# Patient Record
Sex: Female | Born: 1935 | Race: Black or African American | Hispanic: No | Marital: Single | Smoking: Never smoker
Health system: Southern US, Community
[De-identification: ages and names within clinical notes are randomized; demographics above are authoritative.]

## PROBLEM LIST (undated history)

## (undated) DIAGNOSIS — D510 Vitamin B12 deficiency anemia due to intrinsic factor deficiency: Secondary | ICD-10-CM

## (undated) DIAGNOSIS — E049 Nontoxic goiter, unspecified: Secondary | ICD-10-CM

## (undated) DIAGNOSIS — I509 Heart failure, unspecified: Secondary | ICD-10-CM

## (undated) DIAGNOSIS — D649 Anemia, unspecified: Secondary | ICD-10-CM

## (undated) DIAGNOSIS — Z87442 Personal history of urinary calculi: Secondary | ICD-10-CM

## (undated) DIAGNOSIS — I495 Sick sinus syndrome: Secondary | ICD-10-CM

## (undated) DIAGNOSIS — F09 Unspecified mental disorder due to known physiological condition: Secondary | ICD-10-CM

## (undated) DIAGNOSIS — I499 Cardiac arrhythmia, unspecified: Secondary | ICD-10-CM

## (undated) DIAGNOSIS — J45909 Unspecified asthma, uncomplicated: Secondary | ICD-10-CM

## (undated) DIAGNOSIS — I10 Essential (primary) hypertension: Secondary | ICD-10-CM

## (undated) DIAGNOSIS — I4891 Unspecified atrial fibrillation: Secondary | ICD-10-CM

## (undated) HISTORY — DX: Personal history of urinary calculi: Z87.442

## (undated) HISTORY — DX: Unspecified asthma, uncomplicated: J45.909

## (undated) HISTORY — DX: Cardiac arrhythmia, unspecified: I49.9

## (undated) HISTORY — DX: Sick sinus syndrome: I49.5

## (undated) HISTORY — DX: Heart failure, unspecified: I50.9

## (undated) HISTORY — DX: Unspecified atrial fibrillation: I48.91

## (undated) HISTORY — DX: Nontoxic goiter, unspecified: E04.9

## (undated) HISTORY — DX: Essential (primary) hypertension: I10

## (undated) HISTORY — DX: Vitamin B12 deficiency anemia due to intrinsic factor deficiency: D51.0

## (undated) HISTORY — DX: Anemia, unspecified: D64.9

## (undated) HISTORY — PX: NO PAST SURGERIES: SHX2092

## (undated) HISTORY — DX: Unspecified mental disorder due to known physiological condition: F09

---

## 2005-09-18 ENCOUNTER — Ambulatory Visit: Payer: Self-pay | Admitting: Internal Medicine

## 2006-08-21 ENCOUNTER — Other Ambulatory Visit: Payer: Self-pay

## 2006-08-21 ENCOUNTER — Inpatient Hospital Stay: Payer: Self-pay | Admitting: Internal Medicine

## 2006-10-31 ENCOUNTER — Ambulatory Visit: Payer: Self-pay | Admitting: Ophthalmology

## 2006-11-07 ENCOUNTER — Ambulatory Visit: Payer: Self-pay | Admitting: Ophthalmology

## 2007-02-26 ENCOUNTER — Other Ambulatory Visit: Payer: Self-pay

## 2007-02-26 ENCOUNTER — Inpatient Hospital Stay: Payer: Self-pay | Admitting: Internal Medicine

## 2008-10-19 ENCOUNTER — Ambulatory Visit: Payer: Self-pay | Admitting: Internal Medicine

## 2008-10-22 ENCOUNTER — Ambulatory Visit: Payer: Self-pay | Admitting: Internal Medicine

## 2009-10-02 ENCOUNTER — Inpatient Hospital Stay: Payer: Self-pay | Admitting: Internal Medicine

## 2010-01-18 ENCOUNTER — Ambulatory Visit: Payer: Self-pay | Admitting: Internal Medicine

## 2010-02-13 ENCOUNTER — Inpatient Hospital Stay: Payer: Self-pay | Admitting: Internal Medicine

## 2010-02-17 ENCOUNTER — Ambulatory Visit: Payer: Self-pay | Admitting: Internal Medicine

## 2010-10-14 ENCOUNTER — Inpatient Hospital Stay: Payer: Self-pay | Admitting: Internal Medicine

## 2012-04-22 IMAGING — CT CT CHEST W/ CM
1 series · 15 of 33 positions shown, 19 images · IV contrast (APPLIED)
Comparison: none

REASON FOR EXAM: hi ddimer, sob, hypoxia, clr cxr
COMMENTS:

PROCEDURE:     CT  - CT CHEST (FOR PE) W  - February 13, 2010  [DATE]
RESULT:
TECHNIQUE: Helical 3 mm sections were obtained from the thoracic inlet
through the lung bases status post intravenous administration of 70 mL
Vsovue-PSO.

[Series 6: soft tissue · axial · 0.60mm/px · z∈[-16,+240]mm · 15 of 101 slices shown, 19 images]
[im 8/101  mediastinal]
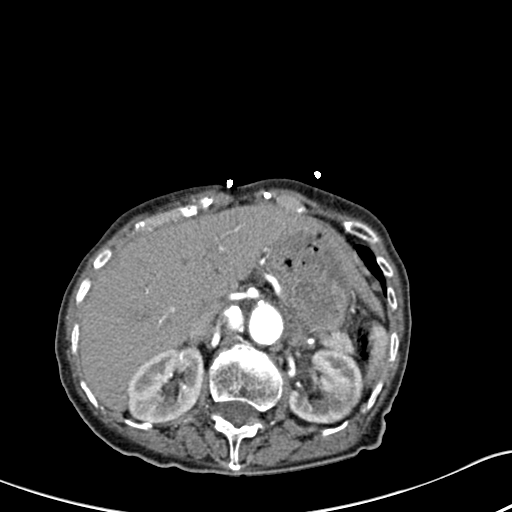
[im 8/101  lung]
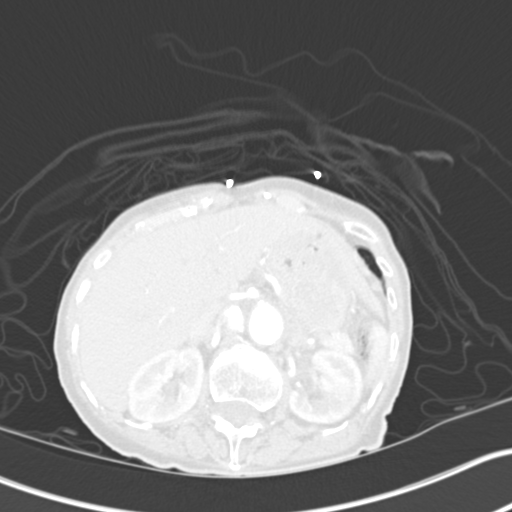
[im 15/101  lung]
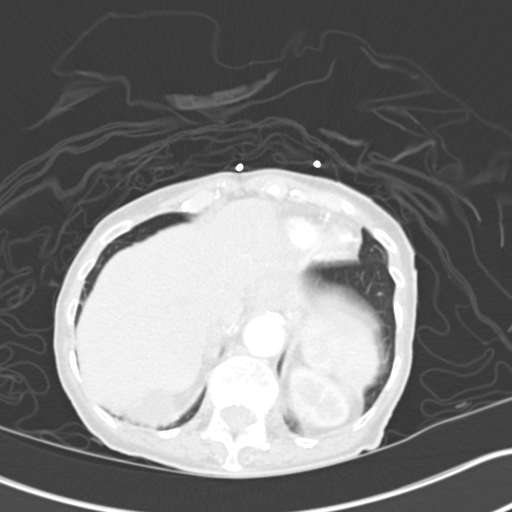
[im 21/101  lung]
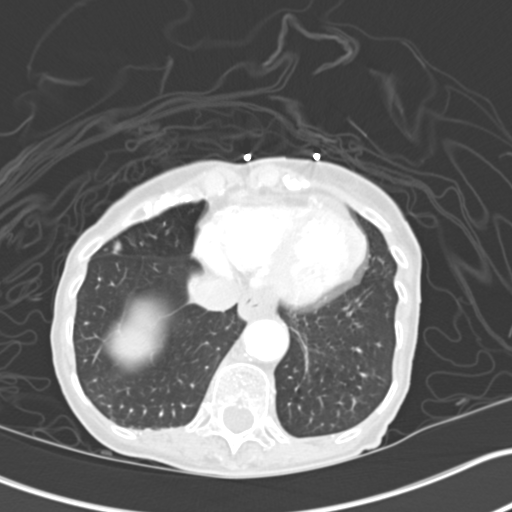
[im 26/101  lung]
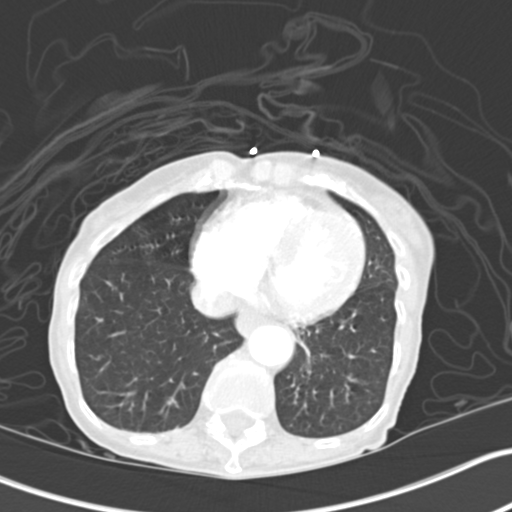
[im 34/101  mediastinal]
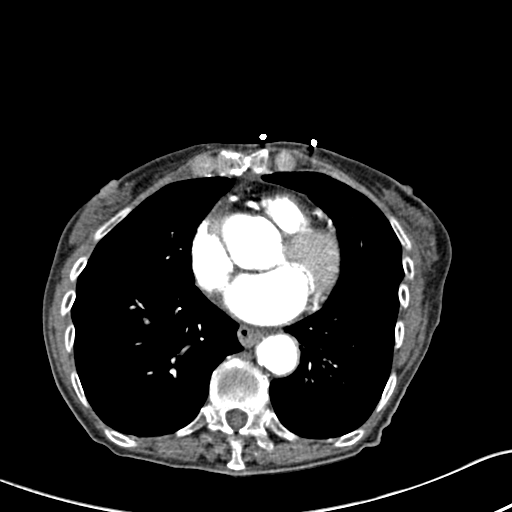
[im 34/101  lung]
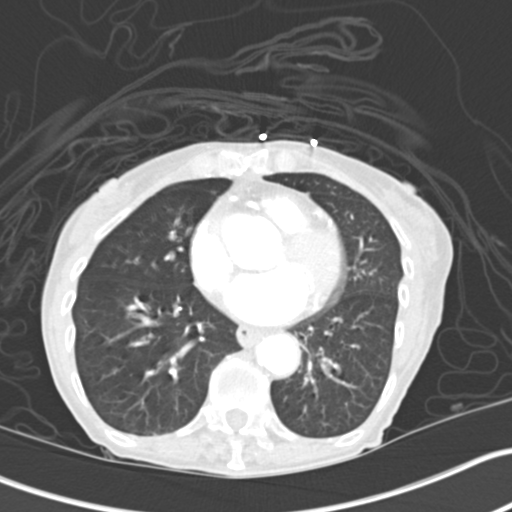
[im 41/101  lung]
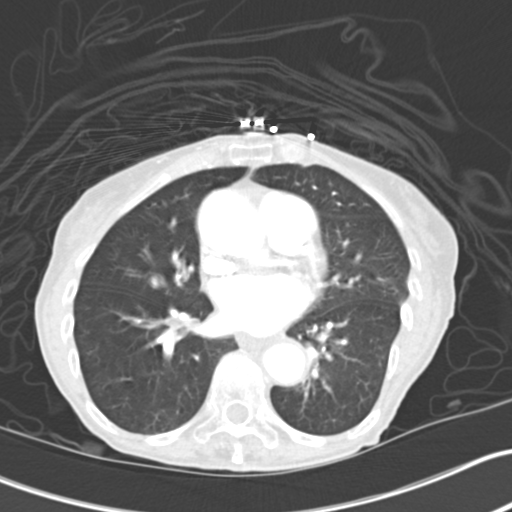
[im 48/101  lung]
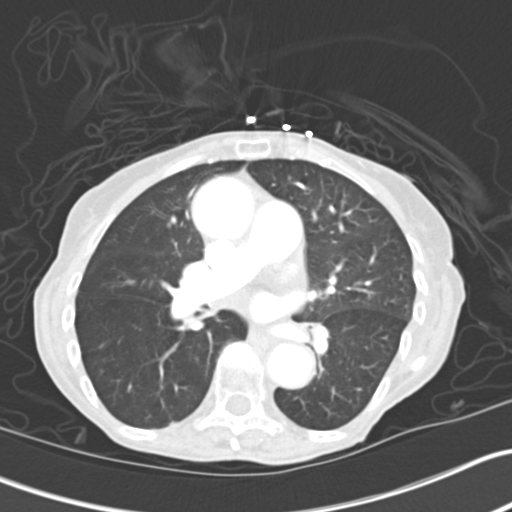
[im 52/101  lung]
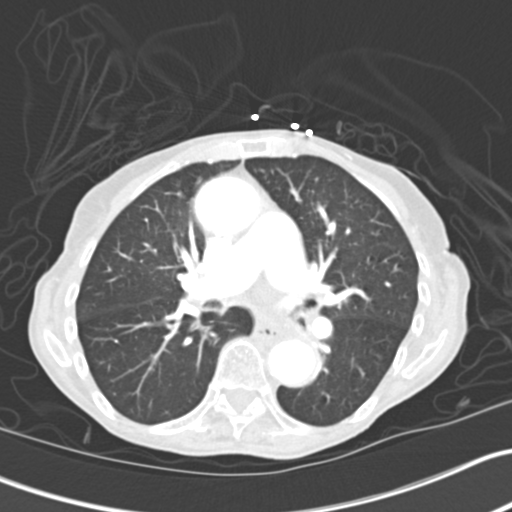
[im 56/101  mediastinal]
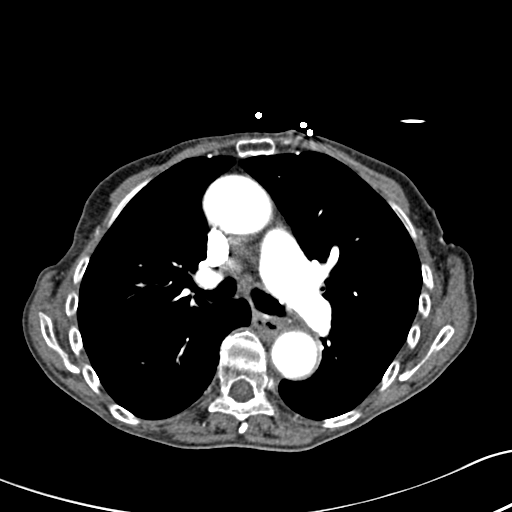
[im 56/101  lung]
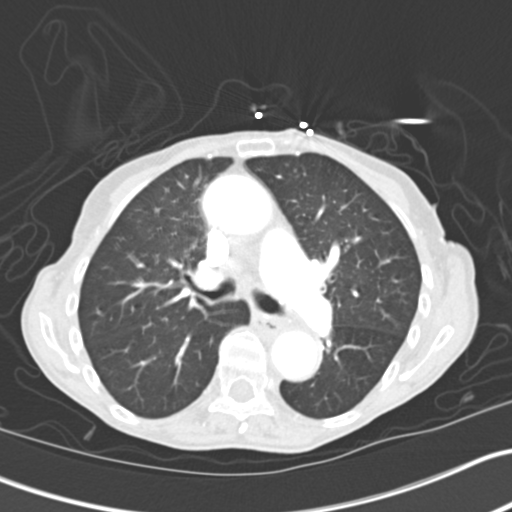
[im 61/101  lung]
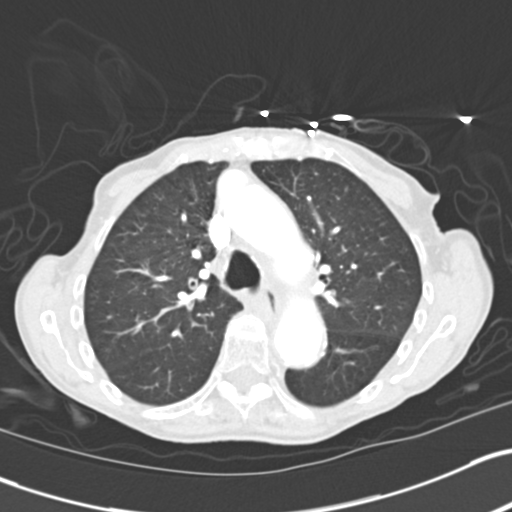
[im 67/101  lung]
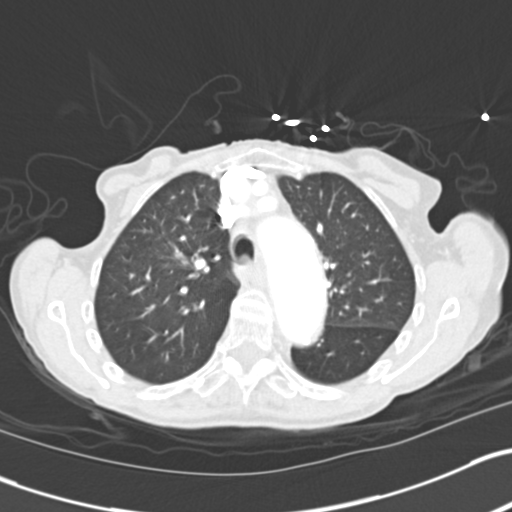
[im 75/101  lung]
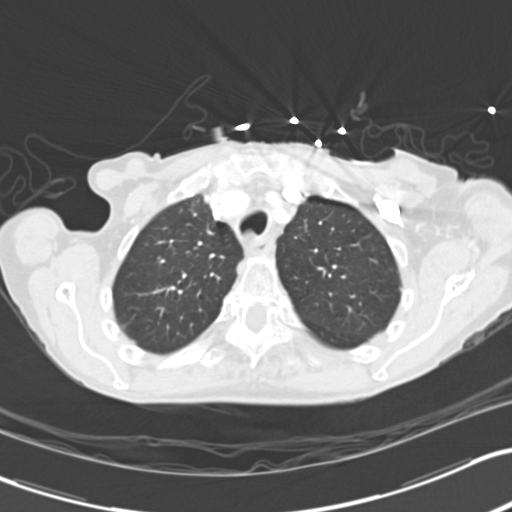
[im 81/101  mediastinal]
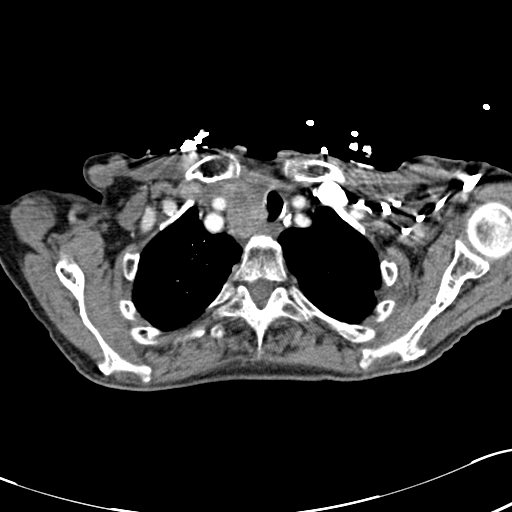
[im 81/101  lung]
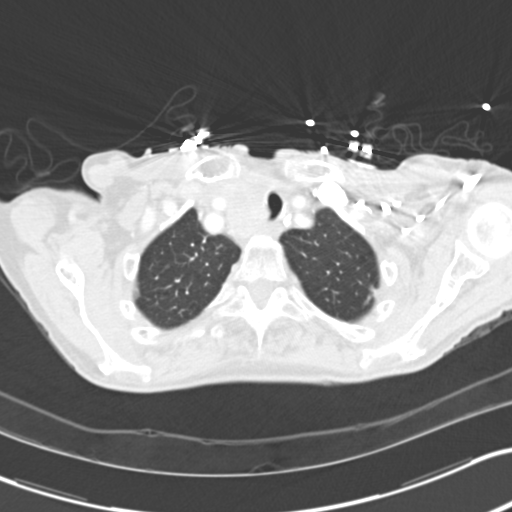
[im 86/101  lung]
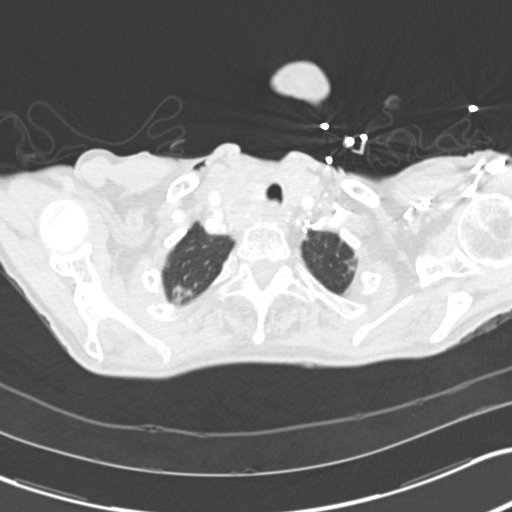
[im 93/101  lung]
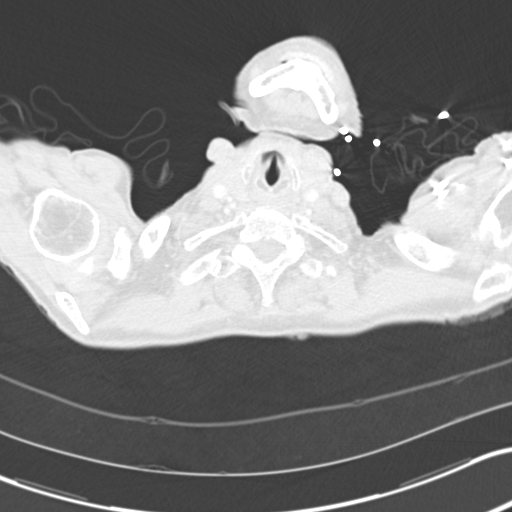

[15 of 33 positions shown; findings below may reference images not displayed]

FINDINGS: There is no evidence of a filling defect within the main, lobar or
segmental pulmonary arteries. Evaluation of the thoracic inlet demonstrates
diffuse enlargement of the thyroid gland right lobe greater than left.
Punctate calcification is identified within the right lobe. Multiple low
attenuating nodules project in the right lobe.

Evaluation of the mediastinum, hilar regions and structures demonstrates no
evidence of mediastinal or hilar adenopathy or masses. Evaluation of the
lung parenchyma demonstrates mild interstitial changes within the lungs
primarily within the lung bases. A vague nodular density projects within the
lateral periphery of the right middle lobe which when compared to the
previous study is unchanged. No further masses or nodules are identified.
Hypoventilation is identified within the lung bases. A cyst is identified
within the right kidney. The remaining visualized upper abdominal viscera
demonstrate a tortuous dilated aorta which demonstrates horizontal
orientation at the level of the renal arteries. The remaining visualized
upper abdominal viscera are grossly unremarkable.
IMPRESSION: 1. No CT evidence for pulmonary arterial embolic disease.
2. Ill-defined nodule within the right middle lobe. This nodule measures
approximately 8 mm in diameter.
3. Ectatic thoracic aorta without evidence of aneurysmal dilatation.
4. Markedly tortuous abdominal aorta with aneurysmal dilatation incompletely
visualized.
5. Interstitial changes.
6. Not mentioned above, there is no CT evidence of pulmonary arterial
embolic disease.

## 2012-04-23 IMAGING — US US EXTREM LOW VENOUS BILAT
1 series · 17 of 24 positions shown · non-contrast
Comparison: none

REASON FOR EXAM: elevated d-dimer, sob
COMMENTS:

PROCEDURE:     US  - US DOPPLER LOW EXTR BILATERAL  - February 14, 2010  [DATE]
RESULT:     Comparison: None

[Series 1: us extrem low venous bilat · 17 of 52 slices shown]
[im 1/52]
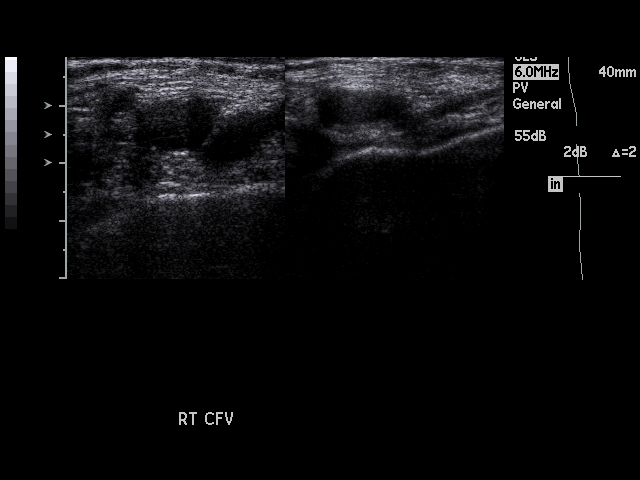
[im 5/52]
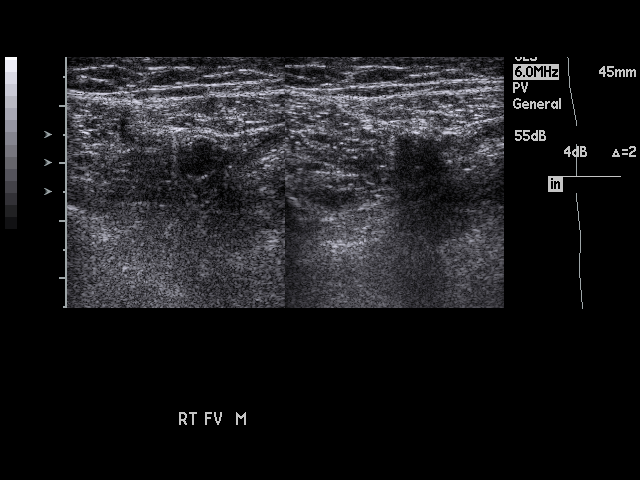
[im 7/52]
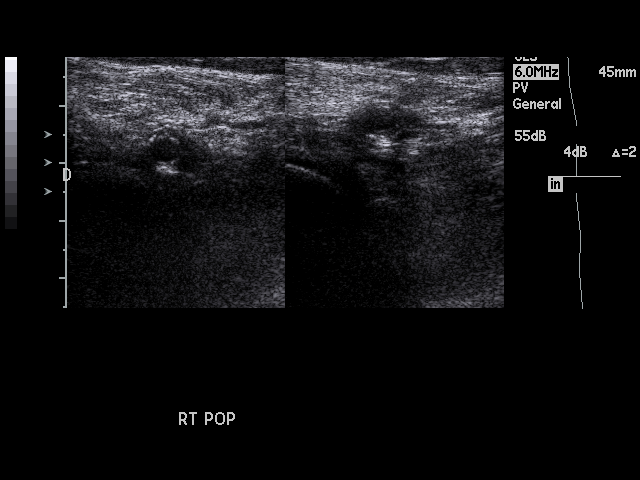
[im 9/52]
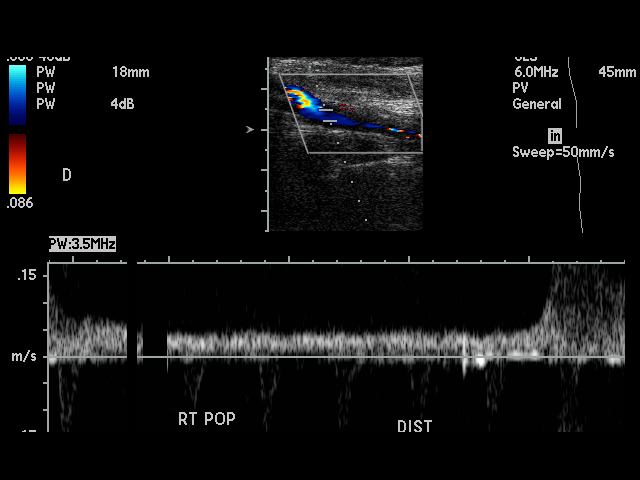
[im 14/52]
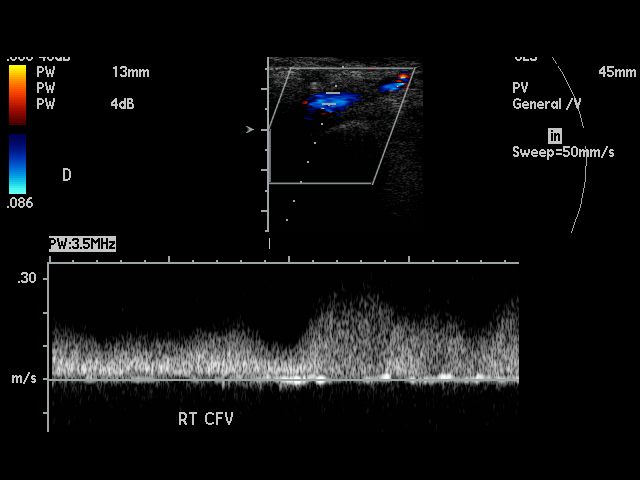
[im 16/52]
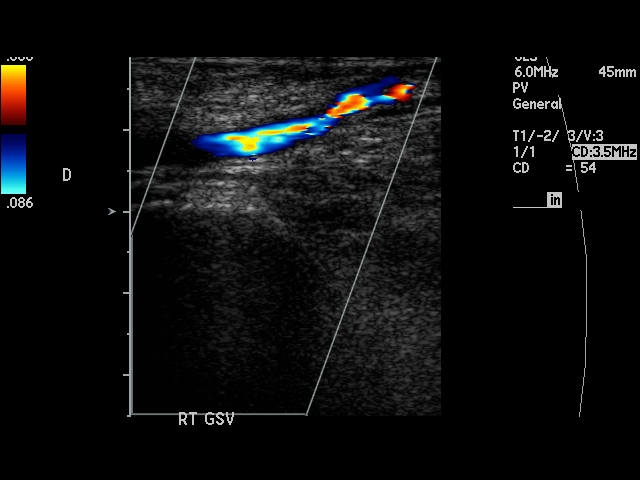
[im 20/52]
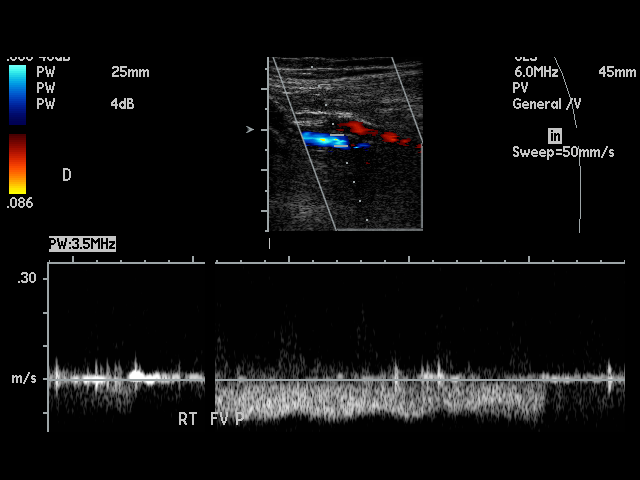
[im 23/52]
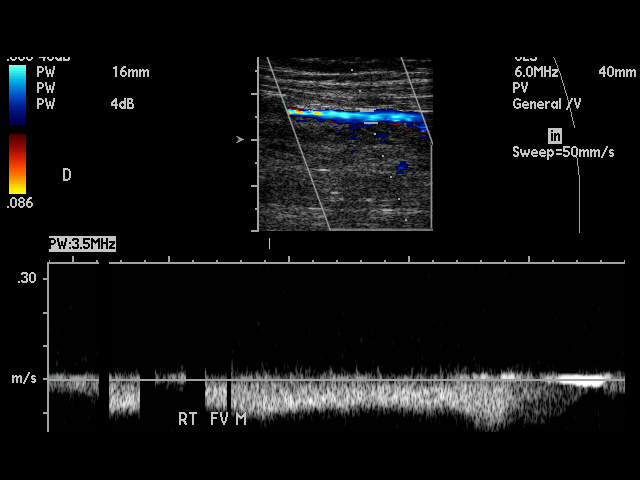
[im 27/52]
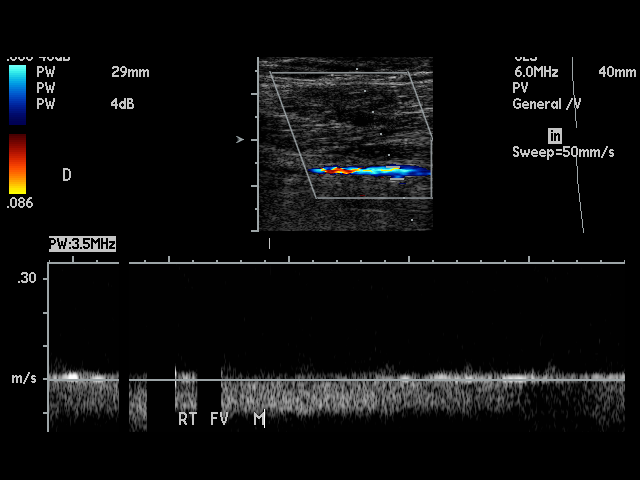
[im 29/52]
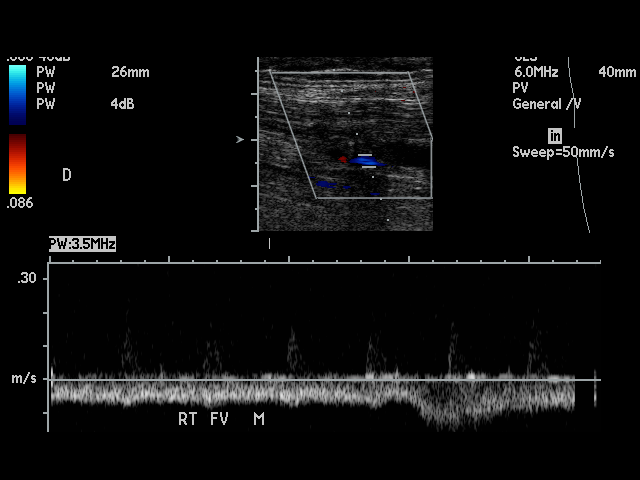
[im 32/52]
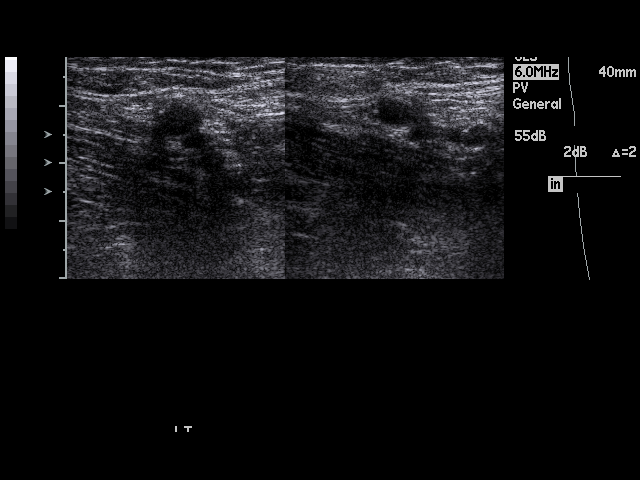
[im 36/52]
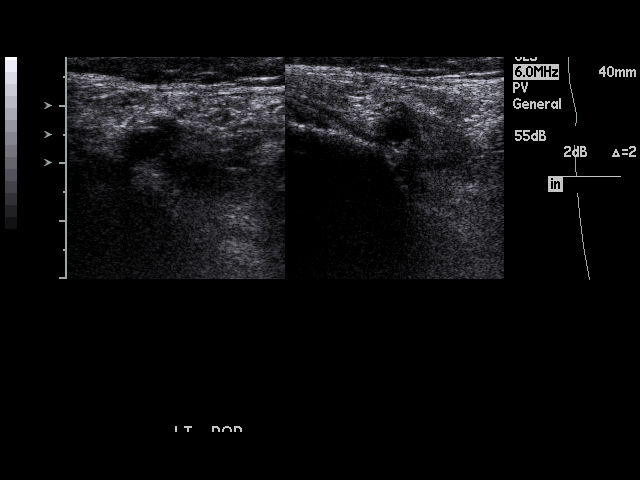
[im 38/52]
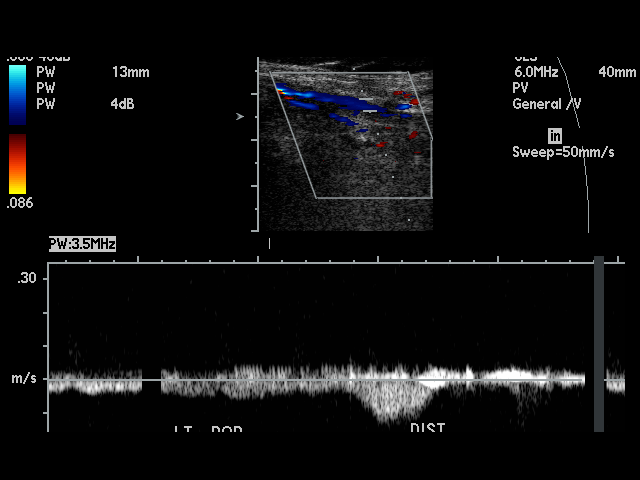
[im 43/52]
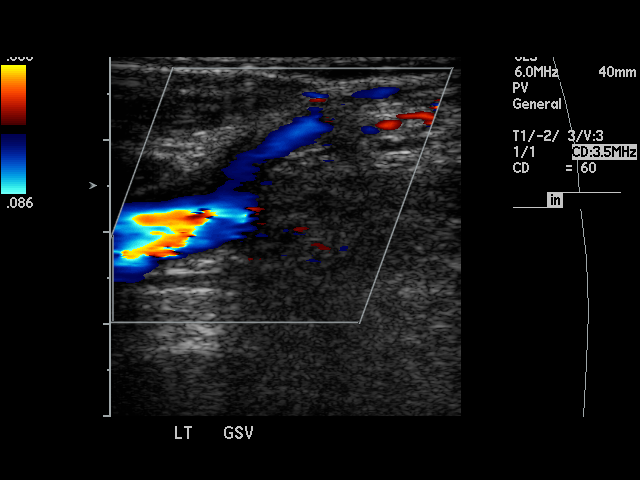
[im 45/52]
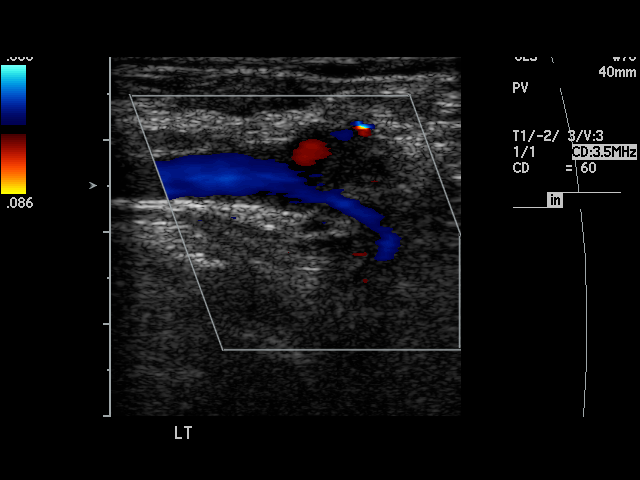
[im 47/52]
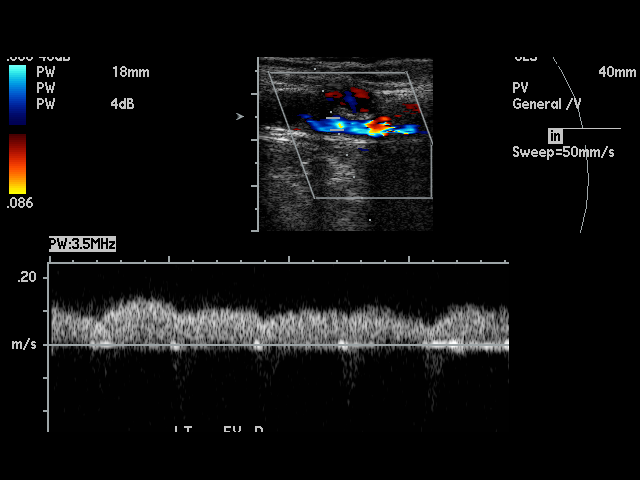
[im 52/52]
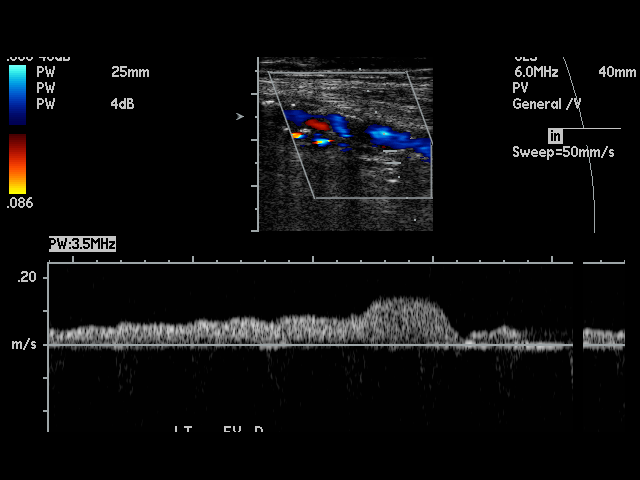

[17 of 24 positions shown; findings below may reference images not displayed]

FINDINGS: Multiple longitudinal and transverse gray-scale as well as color
and spectral Doppler images of  bilateral lower extremity veins were
obtained from the common femoral veins through the popliteal veins.

The right common femoral, greater saphenous, femoral, popliteal veins, and
venous trifurcation are patent, demonstrating normal color-flow and
compressibility. No intraluminal thrombus is identified.There is normal
respiratory variation and augmentation demonstrated at all vein levels.

The left common femoral, greater saphenous, femoral, popliteal veins, and
venous trifurcation are patent, demonstrating normal color-flow and
compressibility. No intraluminal thrombus is identified.There is normal
respiratory variation and augmentation demonstrated at all vein levels.
IMPRESSION: 1. No evidence of DVT in the right lower extremity.
2. No evidence of DVT in the left lower extremity.

## 2012-04-24 IMAGING — CR DG CHEST 2V
1 series · 2 of 2 positions shown · non-contrast
Comparison: none

REASON FOR EXAM: sob
COMMENTS:

PROCEDURE:     DXR - DXR CHEST PA (OR AP) AND LATERAL  - February 15, 2010 [DATE]
RESULT:     Comparison: None

[Series 1: view not recorded · 0.17mm/px · 2 of 2 slices shown]
[im 1/2]
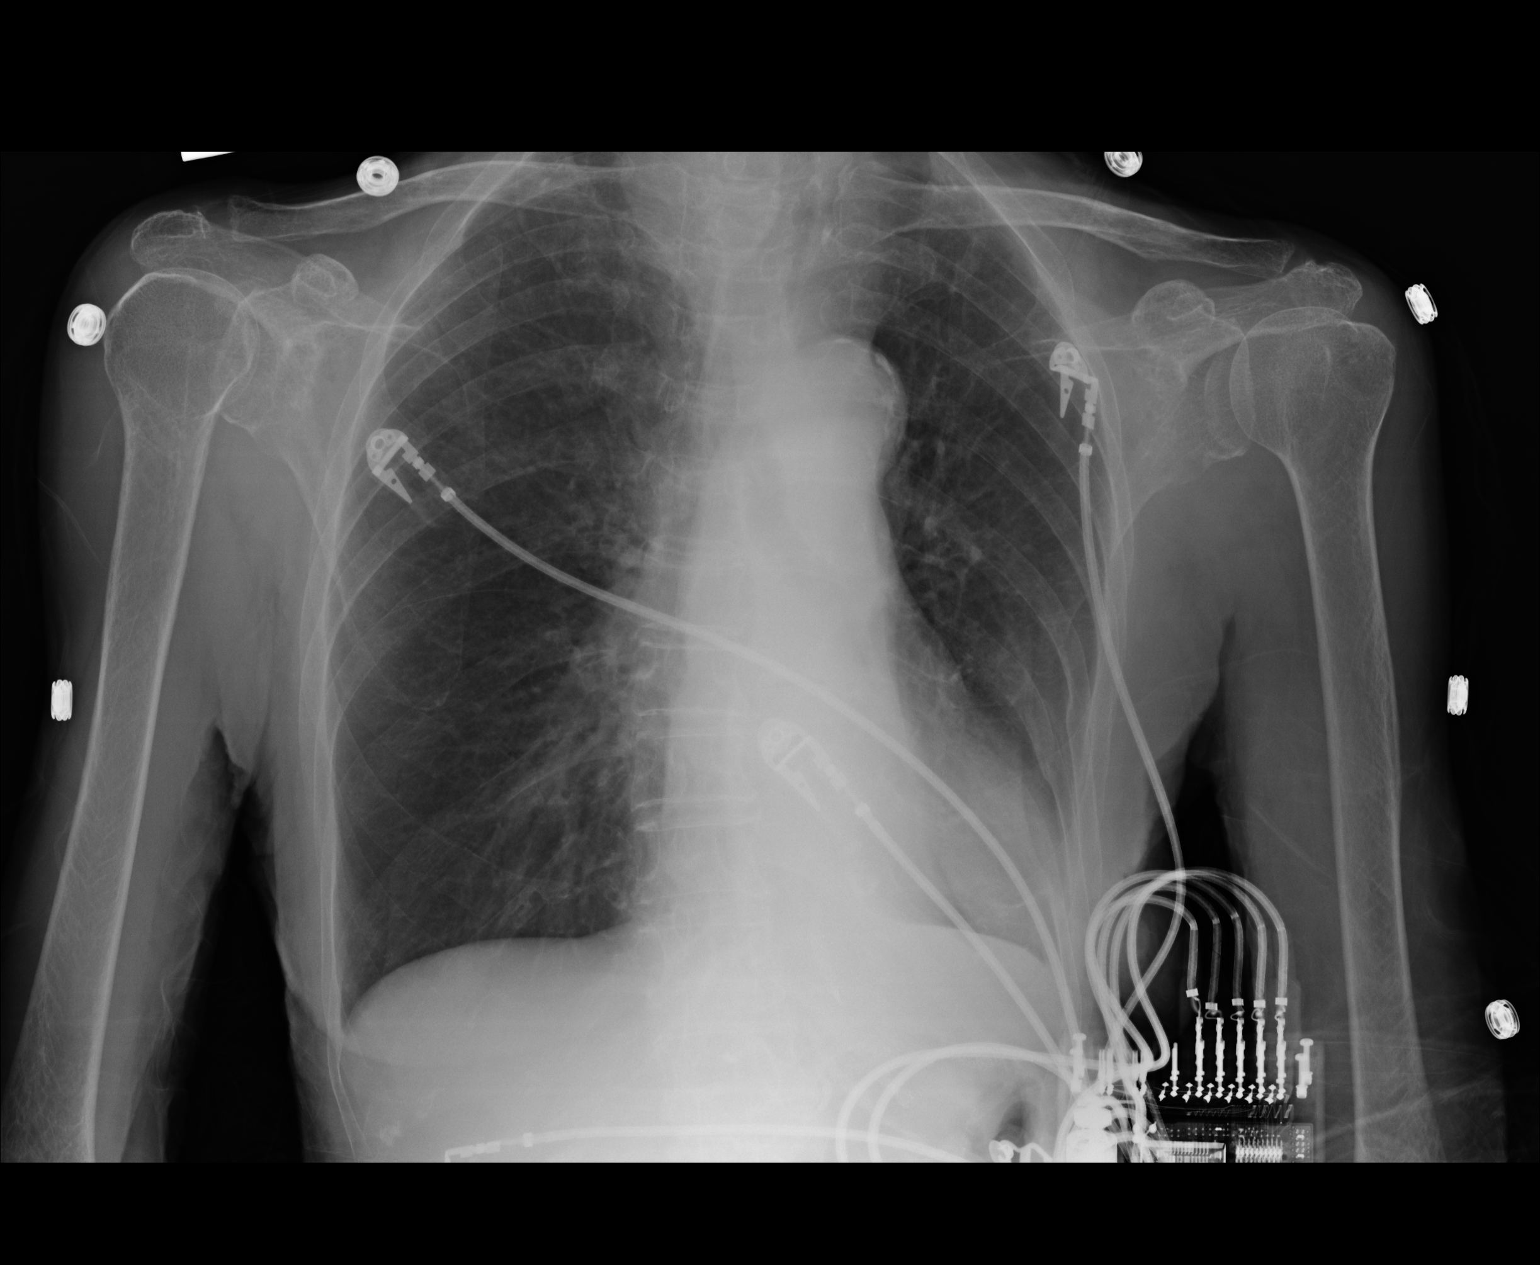
[im 2/2]
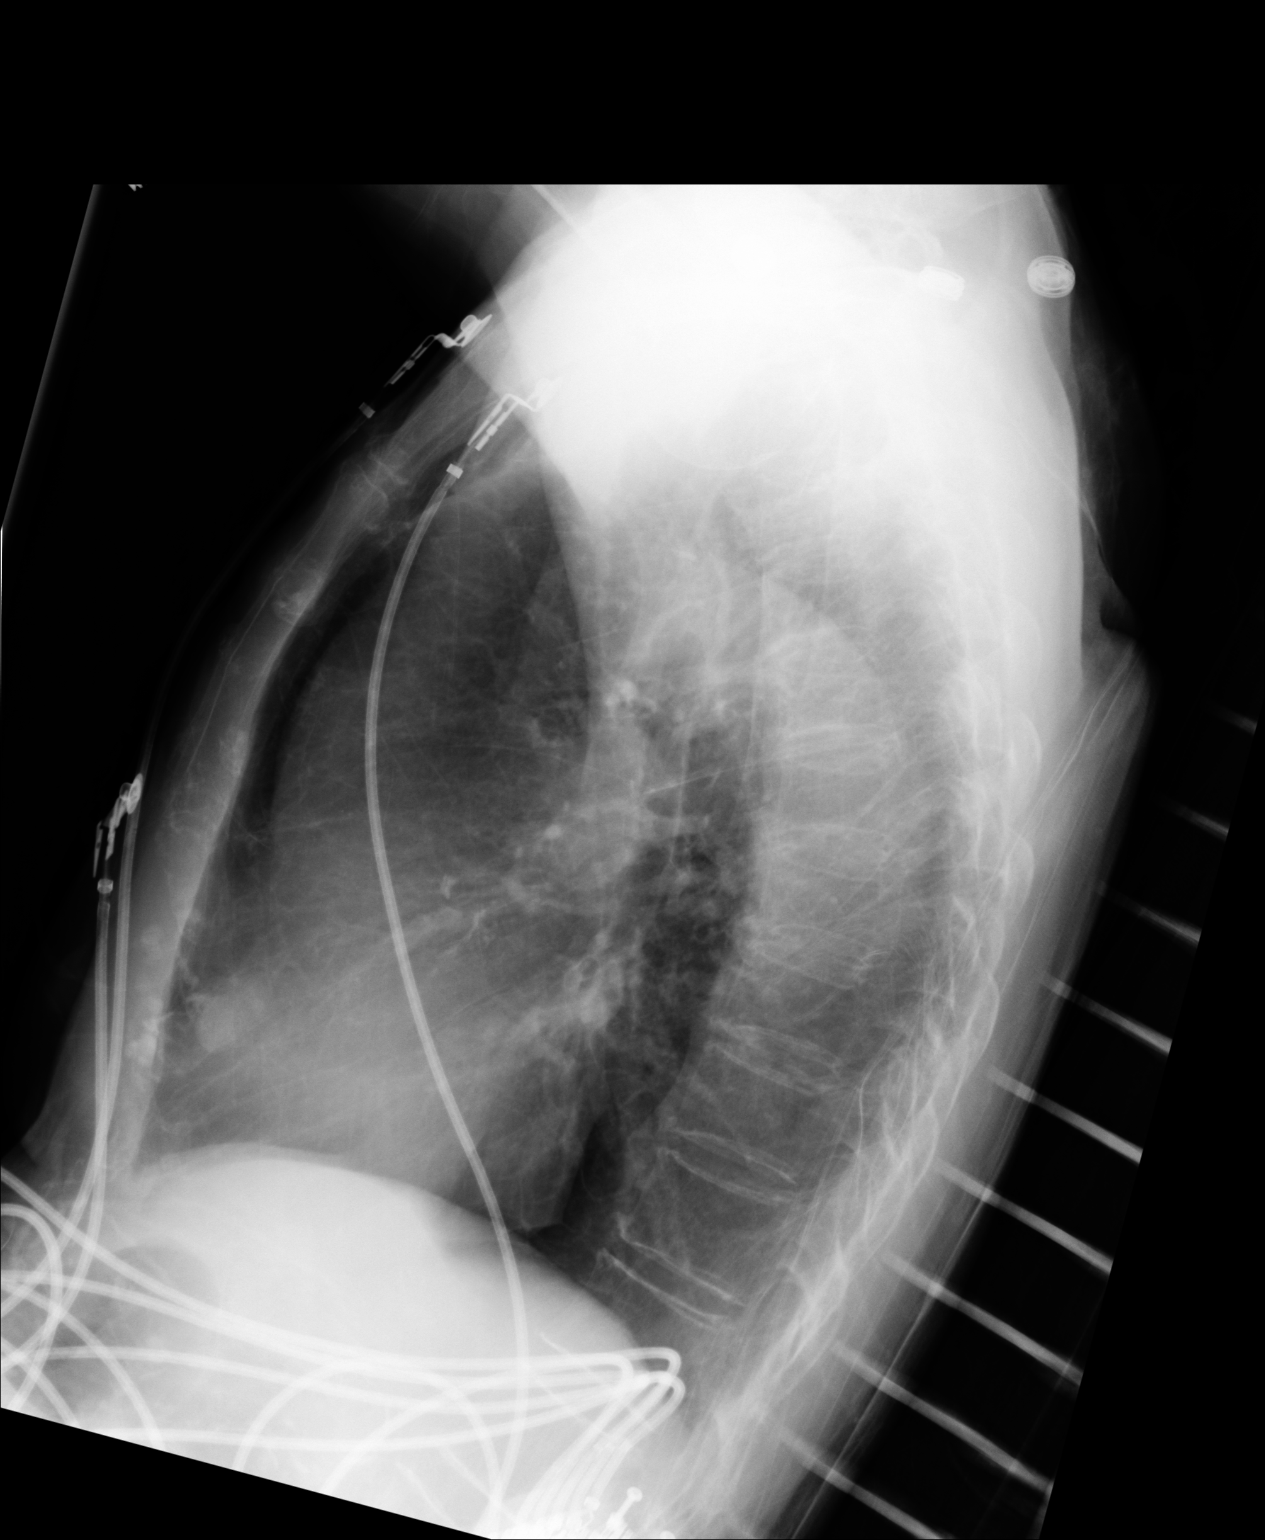

[2 of 2 positions shown; findings below may reference images not displayed]

FINDINGS: PA and lateral chest radiographs are provided. The lungs are hyperinflated
likely secondary to COPD. There is no focal parenchymal opacity, pleural
effusion, or pneumothorax. The heart and mediastinum are unremarkable. The
osseous structures are unremarkable.
IMPRESSION: No acute disease of the chest.

## 2013-08-08 DIAGNOSIS — I499 Cardiac arrhythmia, unspecified: Secondary | ICD-10-CM

## 2013-08-08 DIAGNOSIS — I10 Essential (primary) hypertension: Secondary | ICD-10-CM

## 2013-08-08 DIAGNOSIS — I4891 Unspecified atrial fibrillation: Secondary | ICD-10-CM

## 2013-08-08 DIAGNOSIS — D510 Vitamin B12 deficiency anemia due to intrinsic factor deficiency: Secondary | ICD-10-CM

## 2013-08-08 DIAGNOSIS — I509 Heart failure, unspecified: Secondary | ICD-10-CM

## 2013-08-08 HISTORY — DX: Essential (primary) hypertension: I10

## 2013-08-08 HISTORY — DX: Heart failure, unspecified: I50.9

## 2013-08-08 HISTORY — DX: Cardiac arrhythmia, unspecified: I49.9

## 2013-08-08 HISTORY — DX: Unspecified atrial fibrillation: I48.91

## 2013-08-08 HISTORY — DX: Vitamin B12 deficiency anemia due to intrinsic factor deficiency: D51.0

## 2014-10-15 ENCOUNTER — Ambulatory Visit: Payer: Self-pay

## 2014-10-21 ENCOUNTER — Ambulatory Visit (INDEPENDENT_AMBULATORY_CARE_PROVIDER_SITE_OTHER): Payer: Commercial Managed Care - HMO | Admitting: Urology

## 2014-10-21 ENCOUNTER — Encounter: Payer: Self-pay | Admitting: Urology

## 2014-10-21 VITALS — BP 150/86 | HR 58 | Ht 60.0 in | Wt 85.0 lb

## 2014-10-21 DIAGNOSIS — R3129 Other microscopic hematuria: Secondary | ICD-10-CM

## 2014-10-21 DIAGNOSIS — I495 Sick sinus syndrome: Secondary | ICD-10-CM | POA: Insufficient documentation

## 2014-10-21 DIAGNOSIS — R312 Other microscopic hematuria: Secondary | ICD-10-CM | POA: Diagnosis not present

## 2014-10-21 DIAGNOSIS — J45909 Unspecified asthma, uncomplicated: Secondary | ICD-10-CM

## 2014-10-21 DIAGNOSIS — E049 Nontoxic goiter, unspecified: Secondary | ICD-10-CM

## 2014-10-21 DIAGNOSIS — D649 Anemia, unspecified: Secondary | ICD-10-CM

## 2014-10-21 DIAGNOSIS — F09 Unspecified mental disorder due to known physiological condition: Secondary | ICD-10-CM

## 2014-10-21 DIAGNOSIS — Z87442 Personal history of urinary calculi: Secondary | ICD-10-CM

## 2014-10-21 HISTORY — DX: Personal history of urinary calculi: Z87.442

## 2014-10-21 HISTORY — DX: Unspecified mental disorder due to known physiological condition: F09

## 2014-10-21 HISTORY — DX: Sick sinus syndrome: I49.5

## 2014-10-21 HISTORY — DX: Unspecified asthma, uncomplicated: J45.909

## 2014-10-21 HISTORY — DX: Nontoxic goiter, unspecified: E04.9

## 2014-10-21 HISTORY — DX: Anemia, unspecified: D64.9

## 2014-10-21 LAB — MICROSCOPIC EXAMINATION: BACTERIA UA: NONE SEEN

## 2014-10-21 LAB — URINALYSIS, COMPLETE
Bilirubin, UA: NEGATIVE
Glucose, UA: NEGATIVE
Ketones, UA: NEGATIVE
Nitrite, UA: NEGATIVE
PH UA: 6.5 (ref 5.0–7.5)
Protein, UA: NEGATIVE
SPEC GRAV UA: 1.015 (ref 1.005–1.030)
Urobilinogen, Ur: 1 mg/dL (ref 0.2–1.0)

## 2014-10-21 NOTE — Progress Notes (Signed)
10/21/2014 3:35 PM   Amanda Sandoval 1912-08-11 [id number]  Referring provider: Rafael Bihari, MD [address] [place], [place] [address]  Chief Complaint  Patient presents with  . Hematuria    referred by Amanda Decamp III MD notes in epic in media tab    HPI: Amanda Sandoval is a 81 year old African American female who presents today at the request of her PCP, Amanda Sandoval, for microscopic hematuria.  She was found to have 10-50 RBC's/hpf on 09/09/2014 at his office.    She does not report any gross hematuria and her caregivers do not report any gross hematuria.  She denies any dysuria, suprapubic pain, back pain or flank pain. She also denies any prior history of kidney stone disease.  Her baseline urinary symptoms consist of nocturia and urinary leakage.  She does wear one depends daily. She also has stool incontinence.  She has no previous urological history.   PMH: Past Medical History  Diagnosis Date  . A-fib 08/08/2013  . A-fib 08/08/2013  . CCF (congestive cardiac failure) 08/08/2013  . Benign essential HTN 08/08/2013  . Arrhythmia, sinus node 08/08/2013  . Brady-tachy syndrome 10/21/2014  . Asthma without status asthmaticus 10/21/2014  . 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 Mild cognitive disorder 10/21/2014  . 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 H/O renal calculi 10/21/2014  . Addison anemia 08/08/2013    Surgical History: Past Surgical History  Procedure Laterality Date  . No past surgeries      Home Medications:    Medication List       This list is accurate as of: 10/21/14  3:35 PM.  Always use your most recent med list.               ADVAIR DISKUS 100-50 MCG/DOSE Aepb  Generic drug:  Fluticasone-Salmeterol  Inhale into the lungs.     aspirin EC 81 MG tablet  Take by mouth.     budesonide-formoterol 160-4.5 MCG/ACT inhaler  Commonly known as:  SYMBICORT  Inhale into the lungs.     levalbuterol 0.63 MG/3ML nebulizer solution    Commonly known as:  XOPENEX  Inhale into the lungs.     PROVENTIL HFA 108 (90 BASE) MCG/ACT inhaler  Generic drug:  albuterol  Inhale into the lungs.     quinapril-hydrochlorothiazide 20-12.5 MG per tablet  Commonly known as:  ACCURETIC  TAKE 1 TABLET BY MOUTH ONCE DAILY.        Allergies: No Known Allergies  Family History: Family History  Problem Relation Age of Onset  . Cancer Neg Hx   . Kidney disease Neg Hx   . Hematuria Neg Hx     Social History:  reports that she has never smoked. Her smokeless tobacco use includes Snuff. She reports that she does not drink alcohol or use illicit drugs.  ROS: UROLOGY Frequent Urination?: No Hard to postpone urination?: No Burning/pain with urination?: No Get up at night to urinate?: No Leakage of urine?: Yes Urine stream starts and stops?: Yes Trouble starting stream?: No Do you have to strain to urinate?: No Blood in urine?: Yes Urinary tract infection?: No Sexually transmitted disease?: No Injury to kidneys or bladder?: No Painful intercourse?: No Weak stream?: No Currently pregnant?: No Vaginal bleeding?: No Last menstrual period?: No  Gastrointestinal Nausea?: No Vomiting?: No Indigestion/heartburn?: No Diarrhea?: No Constipation?: No  Constitutional Fever: No Night sweats?: No Weight loss?: No Fatigue?: No  Skin Skin  rash/lesions?: No Itching?: No  Eyes Blurred vision?: No Double vision?: No  Ears/Nose/Throat Sore throat?: No Sinus problems?: No  Hematologic/Lymphatic Swollen glands?: No Easy bruising?: No  Cardiovascular Leg swelling?: No Chest pain?: No  Respiratory Cough?: No Shortness of breath?: No  Endocrine Excessive thirst?: No  Musculoskeletal Back pain?: No Joint pain?: No  Neurological Headaches?: No Dizziness?: No  Psychologic Depression?: No Anxiety?: No  Physical Exam: BP 150/86 mmHg  Pulse 58  Ht 5' (1.524 m)  Constitutional:  Alert and oriented, No  acute distress. HEENT: Amanda Sandoval AT, moist mucus membranes.  Trachea midline, no masses. Cardiovascular: No clubbing, cyanosis, or edema. Respiratory: Normal respiratory effort, no increased work of breathing. GI: Abdomen is soft, nontender, nondistended, no abdominal masses GU: No CVA tenderness. Atrophic external genitalia.  Normal urethral meatus. No urethral masses and/or tenderness. No bladder fullness or masses. No vaginal lesions or discharge. Normal rectal tone, no masses. Normal anus and perineum.  Skin: No rashes, bruises or suspicious lesions. Lymph: No cervical or inguinal adenopathy. Neurologic: Grossly intact, no focal deficits, moving all 4 extremities. Psychiatric: Normal mood and affect.  Laboratory Data: Results for orders placed or performed in visit on 10/21/14  Microscopic Examination  Result Value Ref Range   WBC, UA 0-5 0 -  5 /hpf   RBC, UA 3-10 (A) 0 -  2 /hpf   Epithelial Cells (non renal) 0-10 0 - 10 /hpf   Mucus, UA Present (A) Not Estab.   Bacteria, UA None seen None seen/Few  Urinalysis, Complete  Result Value Ref Range   Specific Gravity, UA 1.015 1.005 - 1.030   pH, UA 6.5 5.0 - 7.5   Color, UA Yellow Yellow   Appearance Ur Clear Clear   Leukocytes, UA Trace (A) Negative   Protein, UA Negative Negative/Trace   Glucose, UA Negative Negative   Ketones, UA Negative Negative   RBC, UA 1+ (A) Negative   Bilirubin, UA Negative Negative   Urobilinogen, Ur 1.0 0.2 - 1.0 mg/dL   Nitrite, UA Negative Negative   Microscopic Examination See below:    No results found for: WBC, HGB, HCT, MCV, PLT  No results found for: CREATININE  No results found for: PSA  No results found for: TESTOSTERONE  No results found for: HGBA1C  Urinalysis No results found for: COLORURINE, APPEARANCEUR, LABSPEC, PHURINE, GLUCOSEU, HGBUR, BILIRUBINUR, KETONESUR, PROTEINUR, UROBILINOGEN, NITRITE, LEUKOCYTESUR  Pertinent Imaging:  Assessment & Plan:    1. Microscopic hematuria:    I had a discussion with the patient and her son regarding the causes of hematuria, such as: stones, UTI's, damage to the urinary tract and/or cancer.  Because of her age, we have decided to forgo a formal hematuria work up at this time.    I will send her urine for culture.  If it returns positive for infection, we will treat with the appropriate antibiotic.    Her son will contact us if she should develop gross hematuria or flank pain.  - Urinalysis, Complete   Return for pending labs.  Michiel Cowboy, PA-C  [facility] [address] [place] , [place] [address] [phone]

## 2014-10-22 ENCOUNTER — Encounter: Payer: Self-pay | Admitting: Urology

## 2014-10-23 ENCOUNTER — Telehealth: Payer: Self-pay | Admitting: Urology

## 2014-10-23 LAB — CULTURE, URINE COMPREHENSIVE

## 2014-10-23 NOTE — Telephone Encounter (Signed)
Patient's UCx was negative.  Patient to return to clinic if she should develop gross hematuria.

## 2014-10-26 NOTE — Telephone Encounter (Signed)
Spoke with pt son in reference to negative urine cx. Son voiced understanding.

## 2015-04-21 DEATH — deceased
